# Patient Record
Sex: Male | Born: 1965 | Race: Black or African American | Hispanic: No | Marital: Single | Smoking: Current every day smoker
Health system: Southern US, Community
[De-identification: ages and names within clinical notes are randomized; demographics above are authoritative.]

## PROBLEM LIST (undated history)

## (undated) DIAGNOSIS — G43909 Migraine, unspecified, not intractable, without status migrainosus: Secondary | ICD-10-CM

## (undated) DIAGNOSIS — I10 Essential (primary) hypertension: Secondary | ICD-10-CM

---

## 2018-04-02 ENCOUNTER — Emergency Department (HOSPITAL_COMMUNITY)
Admission: EM | Admit: 2018-04-02 | Discharge: 2018-04-03 | Disposition: A | Discharge status: Home / Self Care | Payer: Medicaid Other | Attending: Emergency Medicine | Admitting: Emergency Medicine

## 2018-04-02 ENCOUNTER — Encounter (HOSPITAL_COMMUNITY): Payer: Self-pay

## 2018-04-02 ENCOUNTER — Emergency Department (HOSPITAL_COMMUNITY): Payer: Medicaid Other

## 2018-04-02 DIAGNOSIS — G43909 Migraine, unspecified, not intractable, without status migrainosus: Secondary | ICD-10-CM | POA: Insufficient documentation

## 2018-04-02 DIAGNOSIS — F17210 Nicotine dependence, cigarettes, uncomplicated: Secondary | ICD-10-CM | POA: Diagnosis not present

## 2018-04-02 DIAGNOSIS — I10 Essential (primary) hypertension: Secondary | ICD-10-CM | POA: Diagnosis not present

## 2018-04-02 HISTORY — DX: Essential (primary) hypertension: I10

## 2018-04-02 HISTORY — DX: Migraine, unspecified, not intractable, without status migrainosus: G43.909

## 2018-04-02 LAB — I-STAT CHEM 8, ED
BUN: 24 mg/dL — AB (ref 6–20)
CALCIUM ION: 1.22 mmol/L (ref 1.15–1.40)
CHLORIDE: 103 mmol/L (ref 98–111)
Creatinine, Ser: 2.1 mg/dL — ABNORMAL HIGH (ref 0.61–1.24)
Glucose, Bld: 141 mg/dL — ABNORMAL HIGH (ref 70–99)
HCT: 36 % — ABNORMAL LOW (ref 39.0–52.0)
Hemoglobin: 12.2 g/dL — ABNORMAL LOW (ref 13.0–17.0)
POTASSIUM: 3.7 mmol/L (ref 3.5–5.1)
SODIUM: 134 mmol/L — AB (ref 135–145)
TCO2: 22 mmol/L (ref 22–32)

## 2018-04-02 LAB — I-STAT TROPONIN, ED: Troponin i, poc: 0 ng/mL (ref 0.00–0.08)

## 2018-04-02 LAB — CBG MONITORING, ED: Glucose-Capillary: 141 mg/dL — ABNORMAL HIGH (ref 70–99)

## 2018-04-02 MED ORDER — SODIUM CHLORIDE 0.9 % IV BOLUS
1000.0000 mL | Freq: Once | INTRAVENOUS | Status: AC
  Administered 2018-04-03: 1000 mL via INTRAVENOUS

## 2018-04-02 MED ORDER — METOCLOPRAMIDE HCL 5 MG/ML IJ SOLN
10.0000 mg | Freq: Once | INTRAMUSCULAR | Status: AC
  Administered 2018-04-03: 10 mg via INTRAVENOUS
  Filled 2018-04-02: qty 2

## 2018-04-02 MED ORDER — KETOROLAC TROMETHAMINE 30 MG/ML IJ SOLN
30.0000 mg | Freq: Once | INTRAMUSCULAR | Status: AC
  Administered 2018-04-03: 30 mg via INTRAVENOUS
  Filled 2018-04-02: qty 1

## 2018-04-02 MED ORDER — DEXAMETHASONE SODIUM PHOSPHATE 10 MG/ML IJ SOLN
10.0000 mg | Freq: Once | INTRAMUSCULAR | Status: AC
  Administered 2018-04-03: 10 mg via INTRAVENOUS
  Filled 2018-04-02: qty 1

## 2018-04-02 MED ORDER — DIPHENHYDRAMINE HCL 50 MG/ML IJ SOLN
25.0000 mg | Freq: Once | INTRAMUSCULAR | Status: AC
  Administered 2018-04-03: 25 mg via INTRAVENOUS
  Filled 2018-04-02: qty 1

## 2018-04-02 NOTE — ED Triage Notes (Signed)
Pt comes via [facility] for a migraine, hx of the same, ran out of his medications. PTA 100 mcg of fentanyl. BP 111/74 No neuro deficits noted, states worse headache of his life. Pt woke up with the migraine and got worse over the day. Pt diaphoretic

## 2018-04-02 NOTE — ED Provider Notes (Signed)
[facility] EMERGENCY DEPARTMENT Provider Note   CSN: [id number] Arrival date & time: 04/02/18  2250     History   Chief Complaint Chief Complaint  Patient presents with  . Migraine    HPI Douglas Hodge is a 52 y.o. male.  Patient is a 52 year old male with past medical history of migraine headaches.  Presents today with complaints of severe headache that started this afternoon.  He usually takes Maxalt for these at home, however has run out.  He describes pressure behind his eyes with no associated blurry or double vision.  He does report photophobia.  He denies any fevers, chills, or stiff neck.  He feels nauseated but has not vomited.  The history is provided by the patient.  Migraine  This is a recurrent problem. Episode onset: This afternoon. The problem occurs constantly. The problem has been rapidly worsening. Associated symptoms include headaches. Exacerbated by: Light and noise. Nothing relieves the symptoms. He has tried nothing for the symptoms.    Past Medical History:  Diagnosis Date  . Hypertension   . Migraines     There are no active problems to display for this patient.   History reviewed. No pertinent surgical history.      Home Medications    Prior to Admission medications   Not on File    Family History No family history on file.  Social History Social History   Tobacco Use  . Smoking status: Current Every Day Smoker  . Smokeless tobacco: Never Used  Substance Use Topics  . Alcohol use: Never    Frequency: Never  . Drug use: Never     Allergies   Patient has no known allergies.   Review of Systems Review of Systems  Neurological: Positive for headaches.  All other systems reviewed and are negative.    Physical Exam Updated Vital Signs BP 131/88 (BP Location: Right Arm)   Pulse 80   Temp (!) 97.5 F (36.4 C) (Oral)   Resp 20   SpO2 100%   Physical Exam  Constitutional: He is oriented to person,  place, and time. He appears well-developed and well-nourished. No distress.  HENT:  Head: Normocephalic and atraumatic.  Mouth/Throat: Oropharynx is clear and moist.  Eyes: Pupils are equal, round, and reactive to light. EOM are normal.  Neck: Normal range of motion. Neck supple.  Cardiovascular: Normal rate and regular rhythm. Exam reveals no friction rub.  No murmur heard. Pulmonary/Chest: Effort normal and breath sounds normal. No respiratory distress. He has no wheezes. He has no rales.  Abdominal: Soft. Bowel sounds are normal. He exhibits no distension. There is no tenderness.  Musculoskeletal: Normal range of motion. He exhibits no edema.  Neurological: He is alert and oriented to person, place, and time. No cranial nerve deficit. He exhibits normal muscle tone. Coordination normal.  Skin: Skin is warm and dry. He is not diaphoretic.  Nursing note and vitals reviewed.    ED Treatments / Results  Labs (all labs ordered are listed, but only abnormal results are displayed) Labs Reviewed  CBG MONITORING, ED - Abnormal; Notable for the following components:      Result Value   Glucose-Capillary 141 (*)    All other components within normal limits  PROTIME-INR  APTT  CBC  DIFFERENTIAL  COMPREHENSIVE METABOLIC PANEL  I-STAT TROPONIN, ED  I-STAT CHEM 8, ED    EKG EKG Interpretation  Date/Time:  Sunday April 02 2018 23:06:39 EDT Ventricular Rate:  87 PR  Interval:  136 QRS Duration: 84 QT Interval:  372 QTC Calculation: 447 R Axis:   76 Text Interpretation:  Normal sinus rhythm Low voltage QRS Cannot rule out Anterior infarct , age undetermined Abnormal ECG No old tracing to compare Confirmed by Ward, Baxter Hire [phone]) on 04/02/2018 11:16:52 PM   Radiology Ct Head Wo Contrast  Result Date: 04/02/2018 CLINICAL DATA:  Headache EXAM: CT HEAD WITHOUT CONTRAST TECHNIQUE: Contiguous axial images were obtained from the base of the skull through the vertex without intravenous  contrast. COMPARISON:  None. FINDINGS: Brain: No acute intracranial abnormality. Specifically, no hemorrhage, hydrocephalus, mass lesion, acute infarction, or significant intracranial injury. Vascular: No hyperdense vessel or unexpected calcification. Skull: No acute calvarial abnormality. Sinuses/Orbits: Mucosal thickening in the ethmoid air cells. No acute findings. Other: None IMPRESSION: No intracranial abnormality. Chronic ethmoid sinusitis. Electronically Signed   By: Charlett Nose M.D.   On: 04/02/2018 23:28    Procedures Procedures (including critical care time)  Medications Ordered in ED Medications  ketorolac (TORADOL) 30 MG/ML injection 30 mg (has no administration in time range)  dexamethasone (DECADRON) injection 10 mg (has no administration in time range)  sodium chloride 0.9 % bolus 1,000 mL (has no administration in time range)  diphenhydrAMINE (BENADRYL) injection 25 mg (has no administration in time range)  metoCLOPramide (REGLAN) injection 10 mg (has no administration in time range)     Initial Impression / Assessment and Plan / ED Course  I have reviewed the triage vital signs and the nursing notes.  Pertinent labs & imaging results that were available during my care of the patient were reviewed by me and considered in my medical decision making (see chart for details).  Patient presents with complaints of migraine headache.  He has a history of migraines, but ran out of his Maxalt.  His neurologic exam is nonfocal, head CT is negative, and symptoms have resolved after IV fluids and a migraine cocktail.  I will refill his Maxalt and have him follow-up with his primary doctor as needed.  I doubt any acute pathology such as hemorrhage or mass.  Final Clinical Impressions(s) / ED Diagnoses   Final diagnoses:  None    ED Discharge Orders    None       Geoffery Lyons, MD 04/03/18 [phone]

## 2018-04-03 LAB — CBC
HCT: 36.5 % — ABNORMAL LOW (ref 39.0–52.0)
Hemoglobin: 12 g/dL — ABNORMAL LOW (ref 13.0–17.0)
MCH: 30.4 pg (ref 26.0–34.0)
MCHC: 32.9 g/dL (ref 30.0–36.0)
MCV: 92.4 fL (ref 80.0–100.0)
NRBC: 0 % (ref 0.0–0.2)
PLATELETS: 419 10*3/uL — AB (ref 150–400)
RBC: 3.95 MIL/uL — AB (ref 4.22–5.81)
RDW: 12.6 % (ref 11.5–15.5)
WBC: 12.1 10*3/uL — ABNORMAL HIGH (ref 4.0–10.5)

## 2018-04-03 LAB — PROTIME-INR
INR: 0.97
Prothrombin Time: 12.8 seconds (ref 11.4–15.2)

## 2018-04-03 LAB — COMPREHENSIVE METABOLIC PANEL
ALBUMIN: 4.1 g/dL (ref 3.5–5.0)
ALT: 42 U/L (ref 0–44)
AST: 31 U/L (ref 15–41)
Alkaline Phosphatase: 79 U/L (ref 38–126)
Anion gap: 10 (ref 5–15)
BUN: 22 mg/dL — AB (ref 6–20)
CHLORIDE: 102 mmol/L (ref 98–111)
CO2: 20 mmol/L — ABNORMAL LOW (ref 22–32)
CREATININE: 1.78 mg/dL — AB (ref 0.61–1.24)
Calcium: 10.2 mg/dL (ref 8.9–10.3)
GFR calc Af Amer: 49 mL/min — ABNORMAL LOW (ref >60)
GFR calc non Af Amer: 42 mL/min — ABNORMAL LOW (ref >60)
Glucose, Bld: 138 mg/dL — ABNORMAL HIGH (ref 70–99)
POTASSIUM: 3.7 mmol/L (ref 3.5–5.1)
Sodium: 132 mmol/L — ABNORMAL LOW (ref 135–145)
Total Bilirubin: 0.5 mg/dL (ref 0.3–1.2)
Total Protein: 6.9 g/dL (ref 6.5–8.1)

## 2018-04-03 LAB — DIFFERENTIAL
ABS IMMATURE GRANULOCYTES: 0.07 10*3/uL (ref 0.00–0.07)
BASOS PCT: 1 %
Basophils Absolute: 0.1 10*3/uL (ref 0.0–0.1)
EOS ABS: 0.6 10*3/uL — AB (ref 0.0–0.5)
Eosinophils Relative: 5 %
IMMATURE GRANULOCYTES: 1 %
LYMPHS ABS: 6.3 10*3/uL — AB (ref 0.7–4.0)
Lymphocytes Relative: 51 %
Monocytes Absolute: 1 10*3/uL (ref 0.1–1.0)
Monocytes Relative: 8 %
NEUTROS ABS: 4.1 10*3/uL (ref 1.7–7.7)
NEUTROS PCT: 34 %

## 2018-04-03 LAB — APTT: APTT: 26 s (ref 24–36)

## 2018-04-03 MED ORDER — RIZATRIPTAN BENZOATE 5 MG PO TABS: 5.0000 mg | ORAL_TABLET | ORAL | 1 refills | Status: AC | PRN

## 2018-04-03 NOTE — Discharge Instructions (Addendum)
Maxalt as prescribed.  Follow up with your primary doctor as scheduled, and return to the emergency department if symptoms significantly worsen or change.

## 2018-07-03 ENCOUNTER — Other Ambulatory Visit: Payer: Self-pay | Admitting: Sports Medicine

## 2018-07-03 DIAGNOSIS — G8929 Other chronic pain: Secondary | ICD-10-CM

## 2018-07-03 DIAGNOSIS — M545 Low back pain, unspecified: Secondary | ICD-10-CM

## 2018-07-07 ENCOUNTER — Inpatient Hospital Stay: Admission: RE | Admit: 2018-07-07 | Payer: Medicaid Other | Source: Ambulatory Visit

## 2018-07-18 ENCOUNTER — Inpatient Hospital Stay: Admission: RE | Admit: 2018-07-18 | Payer: Medicaid Other | Source: Ambulatory Visit

## 2018-07-18 ENCOUNTER — Other Ambulatory Visit: Payer: Self-pay | Admitting: Sports Medicine

## 2018-07-18 DIAGNOSIS — G8929 Other chronic pain: Secondary | ICD-10-CM

## 2018-07-18 DIAGNOSIS — M545 Low back pain, unspecified: Secondary | ICD-10-CM

## 2018-07-26 ENCOUNTER — Ambulatory Visit
Admission: RE | Admit: 2018-07-26 | Discharge: 2018-07-26 | Disposition: A | Discharge status: Home / Self Care | Payer: Medicaid Other | Source: Ambulatory Visit | Attending: Sports Medicine | Admitting: Sports Medicine

## 2018-07-26 DIAGNOSIS — M545 Low back pain, unspecified: Secondary | ICD-10-CM

## 2018-07-26 DIAGNOSIS — G8929 Other chronic pain: Secondary | ICD-10-CM

## 2018-07-26 MED ORDER — IOPAMIDOL (ISOVUE-M 200) INJECTION 41%
1.0000 mL | Freq: Once | INTRAMUSCULAR | Status: AC
  Administered 2018-07-26: 1 mL via EPIDURAL

## 2018-07-26 MED ORDER — METHYLPREDNISOLONE ACETATE 40 MG/ML INJ SUSP (RADIOLOG
120.0000 mg | Freq: Once | INTRAMUSCULAR | Status: AC
  Administered 2018-07-26: 120 mg via EPIDURAL

## 2018-07-26 NOTE — Discharge Instructions (Signed)

## 2018-11-14 ENCOUNTER — Other Ambulatory Visit: Payer: Self-pay | Admitting: Sports Medicine

## 2018-11-14 DIAGNOSIS — M545 Low back pain, unspecified: Secondary | ICD-10-CM

## 2018-11-14 DIAGNOSIS — G8929 Other chronic pain: Secondary | ICD-10-CM

## 2018-12-01 ENCOUNTER — Other Ambulatory Visit: Payer: Self-pay

## 2018-12-01 ENCOUNTER — Ambulatory Visit
Admission: RE | Admit: 2018-12-01 | Discharge: 2018-12-01 | Disposition: A | Discharge status: Home / Self Care | Payer: Medicaid Other | Source: Ambulatory Visit | Attending: Sports Medicine | Admitting: Sports Medicine

## 2018-12-01 DIAGNOSIS — G8929 Other chronic pain: Secondary | ICD-10-CM

## 2018-12-01 DIAGNOSIS — M545 Low back pain, unspecified: Secondary | ICD-10-CM

## 2018-12-01 MED ORDER — IOPAMIDOL (ISOVUE-M 200) INJECTION 41%
1.0000 mL | Freq: Once | INTRAMUSCULAR | Status: AC
  Administered 2018-12-01: 1 mL via EPIDURAL

## 2018-12-01 MED ORDER — METHYLPREDNISOLONE ACETATE 40 MG/ML INJ SUSP (RADIOLOG
120.0000 mg | Freq: Once | INTRAMUSCULAR | Status: AC
  Administered 2018-12-01: 08:00:00 120 mg via EPIDURAL

## 2018-12-01 NOTE — Discharge Instructions (Signed)

## 2019-10-03 IMAGING — CT CT HEAD W/O CM
4 series · 17 of 47 positions shown, 19 images · non-contrast
Comparison: None.

CLINICAL DATA: Headache

EXAM:
CT HEAD WITHOUT CONTRAST
TECHNIQUE: Contiguous axial images were obtained from the base of the skull
through the vertex without intravenous contrast.

[Series 3: head bone · axial · 0.48mm/px · z∈[+961,+1031]mm · 4 of 99 slices shown]
[im 10/99  bone]
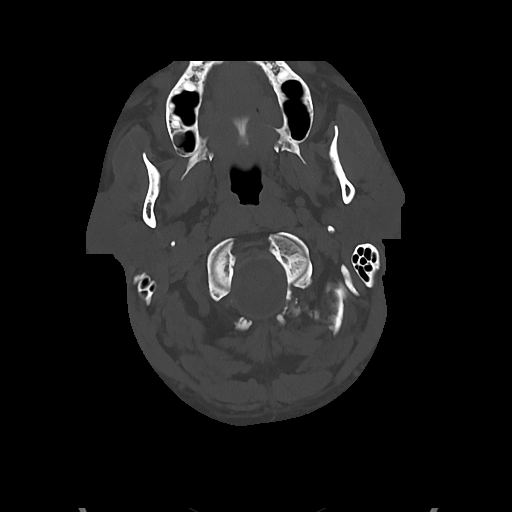
[im 20/99  bone]
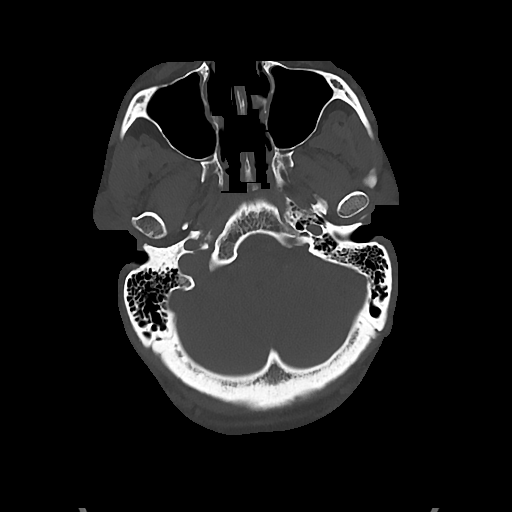
[im 30/99  bone]
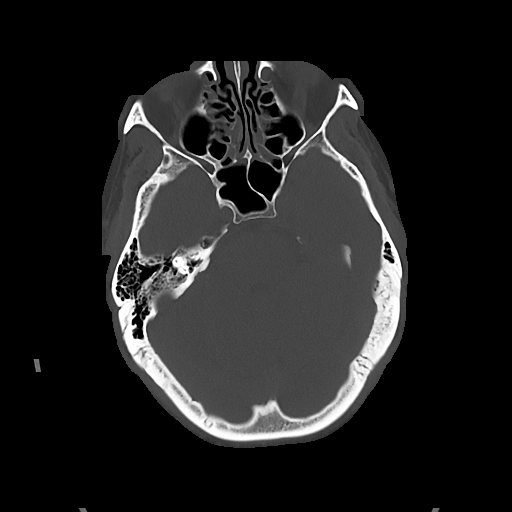
[im 45/99  bone]
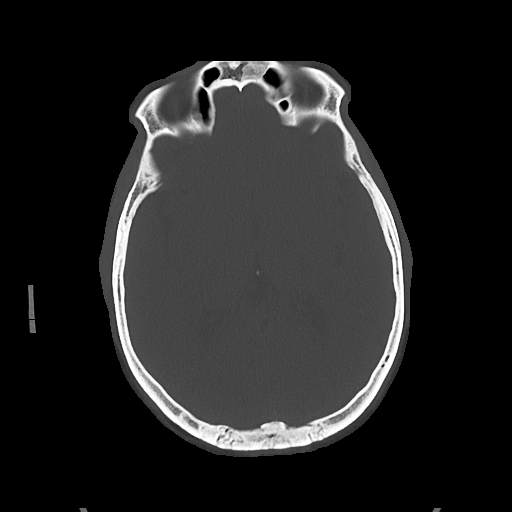

[Series 4: head wo · axial · 0.48mm/px · z∈[+963,+1113]mm · 7 of 40 slices shown, 9 images]
[im 5/40  brain]
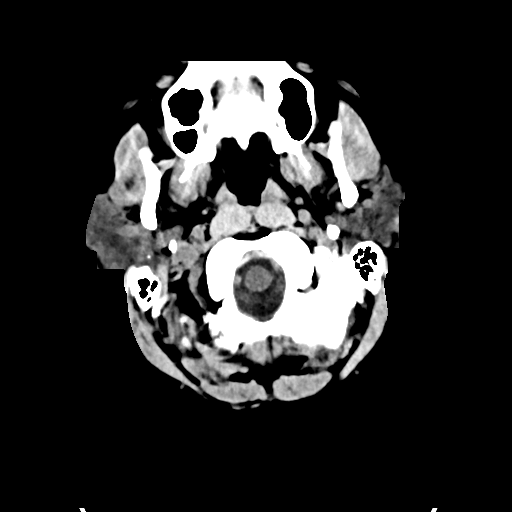
[im 5/40  bone]
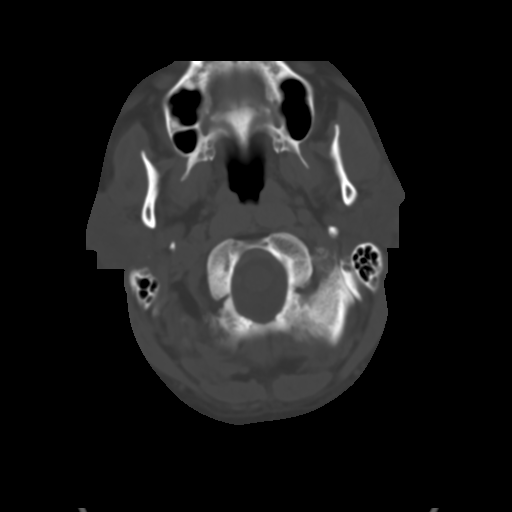
[im 10/40  brain]
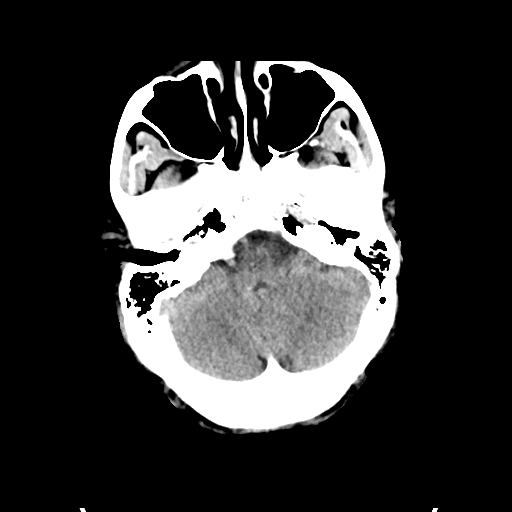
[im 15/40  brain]
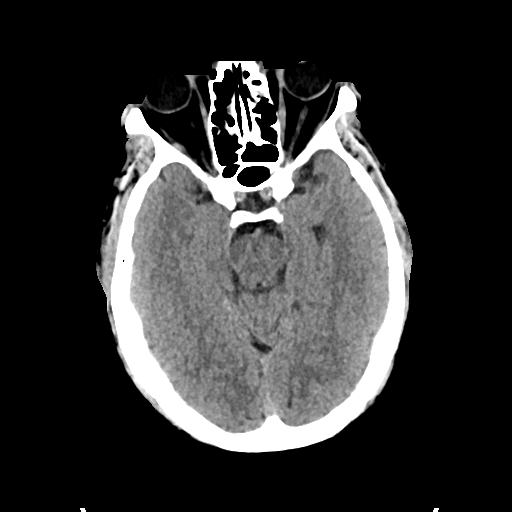
[im 20/40  brain]
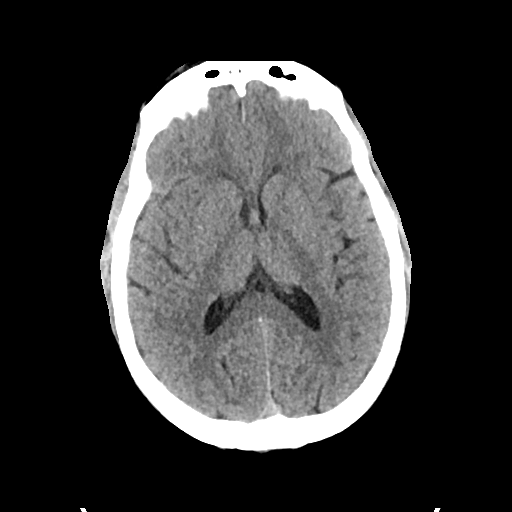
[im 25/40  brain]
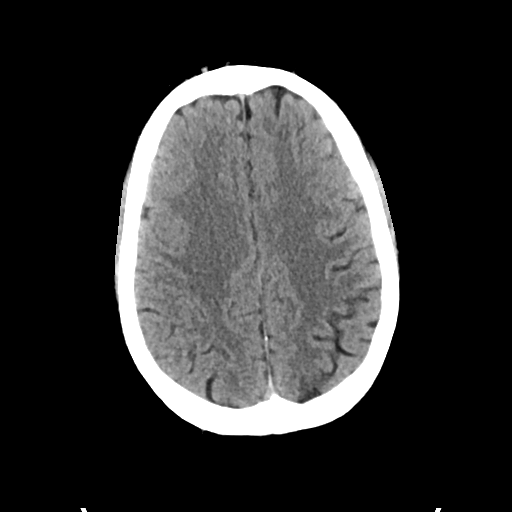
[im 25/40  bone]
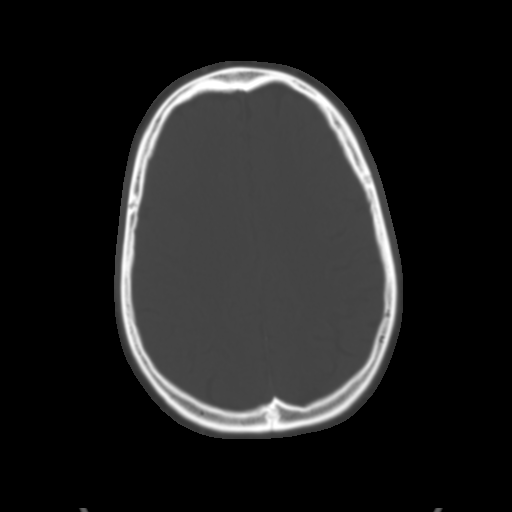
[im 30/40  brain]
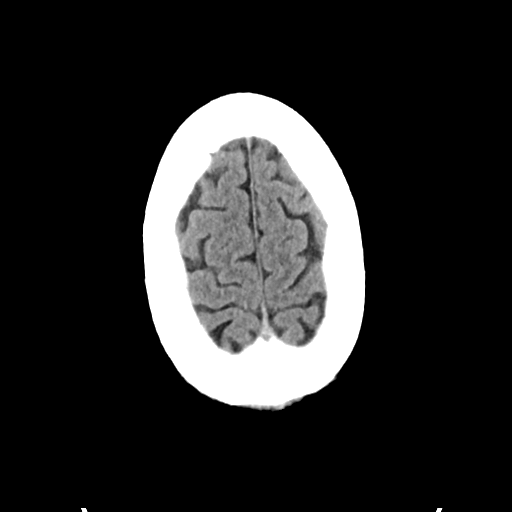
[im 35/40  brain]
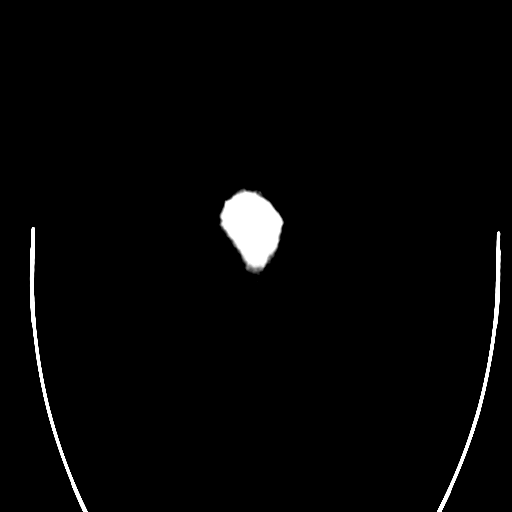

[Series 5: cor soft · coronal · 0.35mm/px · 3 of 75 slices shown]
[im 25/75  brain]
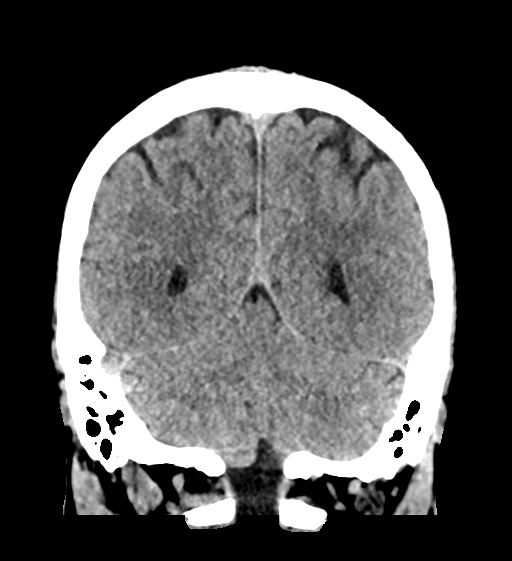
[im 33/75  brain]
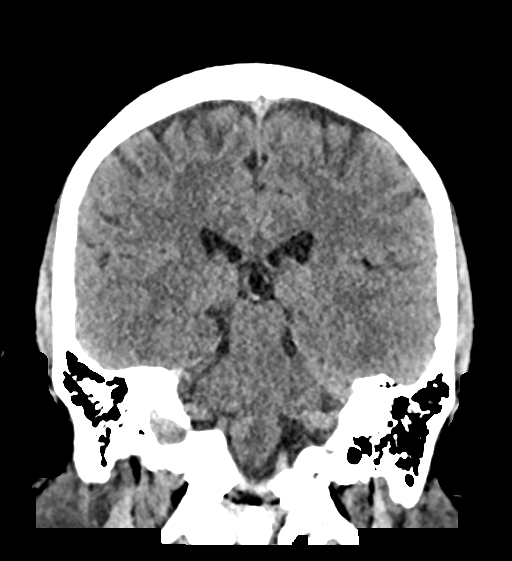
[im 42/75  brain]
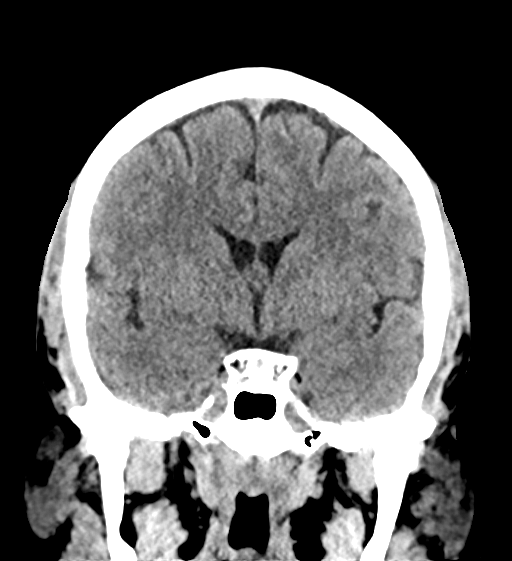

[Series 6: sag soft · sagittal · 0.38mm/px · 3 of 67 slices shown]
[im 23/67  brain]
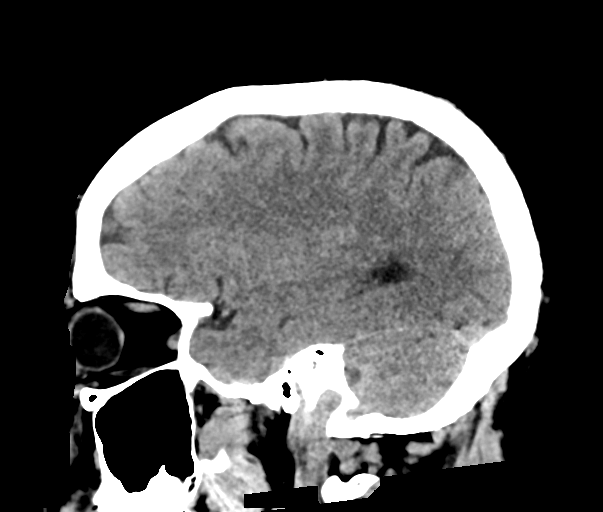
[im 34/67  brain]
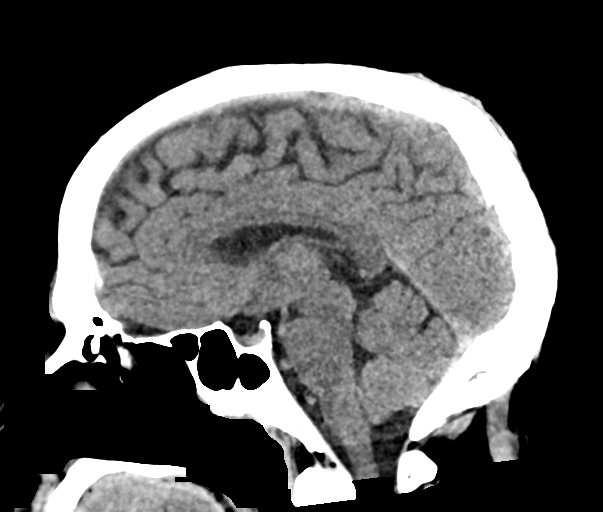
[im 45/67  brain]
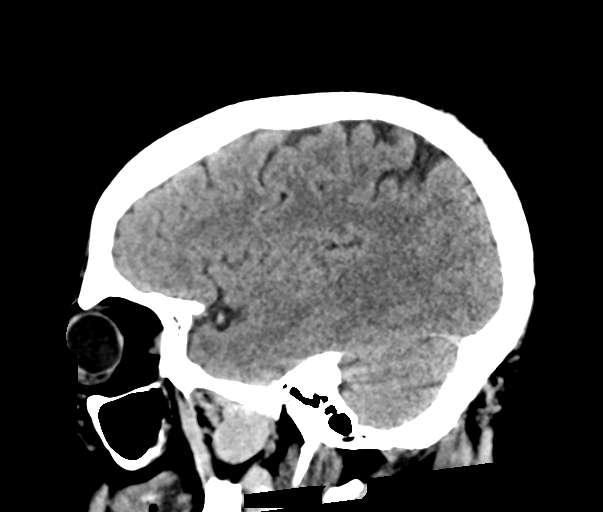

[17 of 47 positions shown; findings below may reference images not displayed]

FINDINGS: Brain: No acute intracranial abnormality. Specifically, no
hemorrhage, hydrocephalus, mass lesion, acute infarction, or
significant intracranial injury.

Vascular: No hyperdense vessel or unexpected calcification.

Skull: No acute calvarial abnormality.

Sinuses/Orbits: Mucosal thickening in the ethmoid air cells. No
acute findings.

Other: None
IMPRESSION: No intracranial abnormality.

Chronic ethmoid sinusitis.

## 2020-01-26 IMAGING — XA Imaging study
2 series · 2 of 2 positions shown · non-contrast
Comparison: none

CLINICAL DATA: Spondylosis without myelopathy. Low back pain. Right
leg pain and numbness radiating all the way to the foot.

[Series 1: ortho standard · 1 of 1 slices shown (1 of 2)]
[im 1/1]
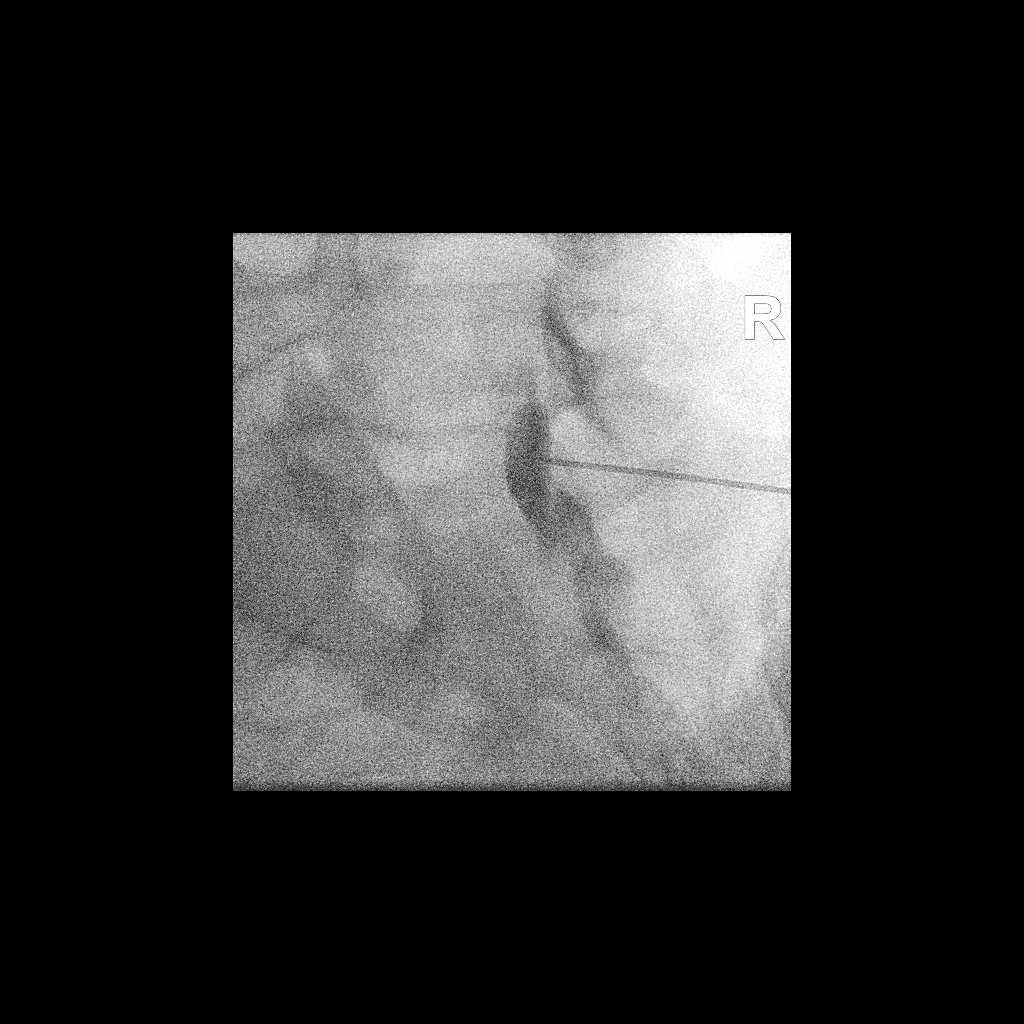

[Series 2: ortho standard · 1 of 1 slices shown (2 of 2)]
[im 1/1]
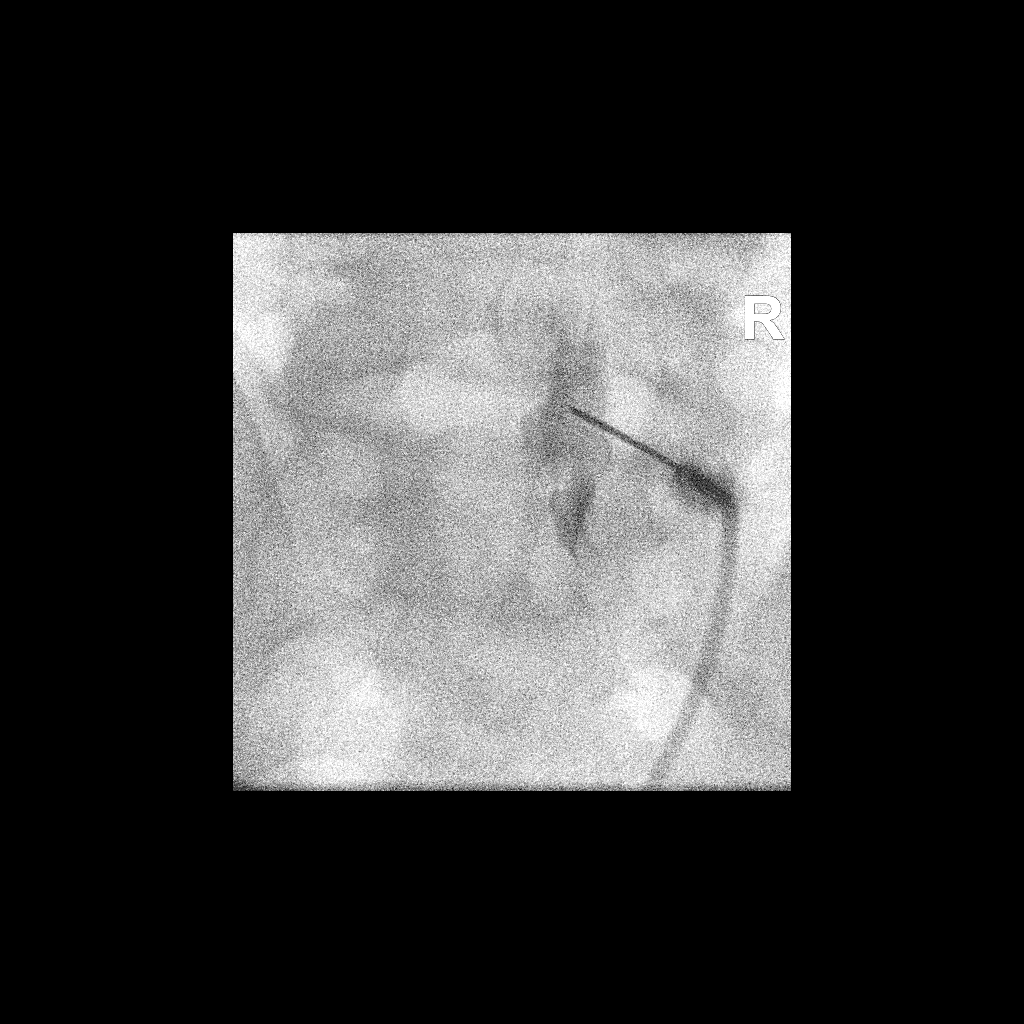

[2 of 2 positions shown; findings below may reference images not displayed]

FLUOROSCOPY TIME:  0 minutes 22 seconds. 18.10 micro gray meter
squared

PROCEDURE:
The procedure, risks, benefits, and alternatives were explained to
the patient. Questions regarding the procedure were encouraged and
answered. The patient understands and consents to the procedure.

LUMBAR EPIDURAL INJECTION:

An interlaminar approach was performed on right at L5-S1. The
overlying skin was cleansed and anesthetized. A 20 gauge epidural
needle was advanced using loss-of-resistance technique.

DIAGNOSTIC EPIDURAL INJECTION:

Injection of Isovue-M 200 shows a good epidural pattern with spread
above and below the level of needle placement, primarily on the
right. No vascular opacification is seen.

THERAPEUTIC EPIDURAL INJECTION:

One hundred twenty mg of Depo-Medrol mixed with 2.5 cc 1% lidocaine
were instilled. The procedure was well-tolerated, and the patient
was discharged thirty minutes following the injection in good
condition.

COMPLICATIONS:
None
IMPRESSION: Technically successful epidural injection on the right L5-S1 # 1.
